# Patient Record
Sex: Male | Born: 2000 | Race: Black or African American | Hispanic: No | Marital: Single | Smoking: Never smoker
Health system: Southern US, Community
[De-identification: ages and names within clinical notes are randomized; demographics above are authoritative.]

## PROBLEM LIST (undated history)

## (undated) DIAGNOSIS — R011 Cardiac murmur, unspecified: Secondary | ICD-10-CM

---
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
## 2020-01-22 ENCOUNTER — Encounter (HOSPITAL_COMMUNITY): Payer: Self-pay

## 2020-01-22 ENCOUNTER — Ambulatory Visit (HOSPITAL_COMMUNITY)
Admission: EM | Admit: 2020-01-22 | Discharge: 2020-01-22 | Disposition: A | Discharge status: Home / Self Care | Payer: 59

## 2020-01-22 ENCOUNTER — Other Ambulatory Visit: Payer: Self-pay

## 2020-01-22 DIAGNOSIS — S0083XA Contusion of other part of head, initial encounter: Secondary | ICD-10-CM | POA: Diagnosis not present

## 2020-01-22 HISTORY — DX: Cardiac murmur, unspecified: R01.1

## 2020-01-22 NOTE — Discharge Instructions (Addendum)
Take 3 ibuprofen every 6 hours for the next 1-2 days and then as needed  Ice the painful area of your jaw today  If any of your pain is not improving, please follow up with your student health  If you develop severe headache, blurry vision, unable to move your jaw please go to the emergency department.

## 2020-01-22 NOTE — ED Notes (Signed)
Bed: UC06 Expected date:  Expected time:  Means of arrival:  Comments: For Appointment
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
## 2021-10-08 ENCOUNTER — Encounter (HOSPITAL_COMMUNITY): Payer: Self-pay

## 2021-10-08 ENCOUNTER — Emergency Department (HOSPITAL_COMMUNITY)
Admission: EM | Admit: 2021-10-08 | Discharge: 2021-10-09 | Disposition: A | Discharge status: Home / Self Care | Payer: 59 | Attending: Emergency Medicine | Admitting: Emergency Medicine

## 2021-10-08 DIAGNOSIS — R251 Tremor, unspecified: Secondary | ICD-10-CM | POA: Diagnosis present

## 2021-10-08 LAB — CBC WITH DIFFERENTIAL/PLATELET
Abs Immature Granulocytes: 0.01 10*3/uL (ref 0.00–0.07)
Basophils Absolute: 0 10*3/uL (ref 0.0–0.1)
Basophils Relative: 0 %
Eosinophils Absolute: 0.1 10*3/uL (ref 0.0–0.5)
Eosinophils Relative: 1 %
HCT: 39.2 % (ref 39.0–52.0)
Hemoglobin: 14.3 g/dL (ref 13.0–17.0)
Immature Granulocytes: 0 %
Lymphocytes Relative: 61 %
Lymphs Abs: 4.3 10*3/uL — ABNORMAL HIGH (ref 0.7–4.0)
MCH: 30 pg (ref 26.0–34.0)
MCHC: 36.5 g/dL — ABNORMAL HIGH (ref 30.0–36.0)
MCV: 82.2 fL (ref 80.0–100.0)
Monocytes Absolute: 0.5 10*3/uL (ref 0.1–1.0)
Monocytes Relative: 7 %
Neutro Abs: 2.2 10*3/uL (ref 1.7–7.7)
Neutrophils Relative %: 31 %
Platelets: 248 10*3/uL (ref 150–400)
RBC: 4.77 MIL/uL (ref 4.22–5.81)
RDW: 12.4 % (ref 11.5–15.5)
WBC: 7.1 10*3/uL (ref 4.0–10.5)
nRBC: 0 % (ref 0.0–0.2)

## 2021-10-08 NOTE — ED Triage Notes (Signed)
Patient arrives from home with complaint of tremors that began 20 minutes ago. Pt states he made a smoothie tonight and feels there may have been some dawn dish detergent in the bottom of it, pt states tremors began shortly after consuming smoothie. Pt also reports tremors seemed to have stopped upon arrival to ED.

## 2021-10-09 LAB — COMPREHENSIVE METABOLIC PANEL
ALT: 25 U/L (ref 0–44)
AST: 35 U/L (ref 15–41)
Albumin: 3.9 g/dL (ref 3.5–5.0)
Alkaline Phosphatase: 55 U/L (ref 38–126)
Anion gap: 6 (ref 5–15)
BUN: 17 mg/dL (ref 6–20)
CO2: 27 mmol/L (ref 22–32)
Calcium: 9.2 mg/dL (ref 8.9–10.3)
Chloride: 106 mmol/L (ref 98–111)
Creatinine, Ser: 0.62 mg/dL (ref 0.61–1.24)
GFR, Estimated: >60 mL/min (ref >60)
Glucose, Bld: 178 mg/dL — ABNORMAL HIGH (ref 70–99)
Potassium: 4.2 mmol/L (ref 3.5–5.1)
Sodium: 139 mmol/L (ref 135–145)
Total Bilirubin: 1 mg/dL (ref 0.3–1.2)
Total Protein: 6.8 g/dL (ref 6.5–8.1)

## 2021-10-09 NOTE — ED Provider Notes (Signed)
[facility] Provider Note   CSN: [id number] Arrival date & time: 10/08/21  2312     History Chief Complaint  Patient presents with   Tremors    Matthew Finley is a 20 y.o. male.  The history is provided by the patient and medical records.   20 year old male presenting to the ED with tremors.  States that started about 20 minutes after he made a smoothie at home.  He did mix and protein powder that he has been taking for the past few weeks.  States he was concerned he may have ingested a little bit of Dawn dish soap from not rinsing the container thoroughly enough.  States it started in his legs and went to his upper body but stopped spontaneously upon arrival to the ED.  He did not have any focal numbness, weakness, or seizure activity.  No chest pain or SOB.  Currently, he feels back to baseline.  Past Medical History:  Diagnosis Date   Heart murmur    no premed required    There are no problems to display for this patient.   History reviewed. No pertinent surgical history.     Family History  Problem Relation Age of Onset   Healthy Mother    Healthy Father     Social History   Tobacco Use   Smoking status: Never   Smokeless tobacco: Never  Vaping Use   Vaping Use: Never used  Substance Use Topics   Alcohol use: Never   Drug use: Never    Home Medications Prior to Admission medications   Not on File    Allergies    Penicillins  Review of Systems   Review of Systems  Neurological:  Positive for tremors (resolved).  All other systems reviewed and are negative.  Physical Exam Updated Vital Signs BP 136/80   Pulse (!) 53   Temp 98.1 F (36.7 C) (Oral)   Resp 18   Ht 6\' 3"  (1.905 m)   Wt 68 kg   SpO2 99%   BMI 18.75 kg/m   Physical Exam Vitals and nursing note reviewed.  Constitutional:      Appearance: He is well-developed.  HENT:     Head: Normocephalic and atraumatic.  Eyes:      Conjunctiva/sclera: Conjunctivae normal.     Pupils: Pupils are equal, round, and reactive to light.  Cardiovascular:     Rate and Rhythm: Normal rate and regular rhythm.     Heart sounds: Normal heart sounds.  Pulmonary:     Effort: Pulmonary effort is normal.     Breath sounds: Normal breath sounds.  Abdominal:     General: Bowel sounds are normal.     Palpations: Abdomen is soft.     Tenderness: There is no guarding.     Hernia: No hernia is present.  Musculoskeletal:        General: Normal range of motion.     Cervical back: Normal range of motion.  Skin:    General: Skin is warm and dry.  Neurological:     Mental Status: He is alert and oriented to person, place, and time.     Comments: AAOx3, answering questions and following commands appropriately; equal strength UE and LE bilaterally; CN grossly intact; moves all extremities appropriately without ataxia; no focal neuro deficits or facial asymmetry appreciated, no tremors or seizure activity observed    ED Results / Procedures / Treatments   Labs (all labs ordered are  listed, but only abnormal results are displayed) Labs Reviewed  COMPREHENSIVE METABOLIC PANEL - Abnormal; Notable for the following components:      Result Value   Glucose, Bld 178 (*)    All other components within normal limits  CBC WITH DIFFERENTIAL/PLATELET - Abnormal; Notable for the following components:   MCHC 36.5 (*)    Lymphs Abs 4.3 (*)    All other components within normal limits    EKG EKG Interpretation  Date/Time:  Friday October 09 2021 01:58:15 EDT Ventricular Rate:  42 PR Interval:  175 QRS Duration: 99 QT Interval:  416 QTC Calculation: 348 R Axis:   78 Text Interpretation: Sinus bradycardia Left atrial enlargement ST elevation c/w early repolarization Confirmed by Tilden Fossa [id number]) on 10/09/2021 2:12:18 AM  Radiology No results found.  Procedures Procedures   Medications Ordered in ED Medications - No data to  display  ED Course  I have reviewed the triage vital signs and the nursing notes.  Pertinent labs & imaging results that were available during my care of the patient were reviewed by me and considered in my medical decision making (see chart for details).    MDM Rules/Calculators/A&P                           20 y.o. M here with tremors that resolved spontaneously on arrival to ED after drinking smoothie at home.  This did contain protein powder.  He was concerned for possible dish soap ingestion due to incomplete rinsing.  He is awake, alert, properly oriented.  He has no visible tremors and no seizure activity.  His neurologic exam is nonfocal.  EKG consistent with early repolarization.  He has not had any chest pain or shortness of breath.  His labs are reassuring.  I have low suspicion this is related to dish soap.  May possibly be related to ingredient in his protein powder.  Doubt acute neurologic or cardiac etiology.  Will have him cut back on this, make sure to hydrate well.  Can follow-up with PCP.  Return here for new concerns.  Final Clinical Impression(s) / ED Diagnoses Final diagnoses:  Tremor    Rx / DC Orders ED Discharge Orders     None        Garlon Hatchet, PA-C 10/09/21 0321    Tilden Fossa, MD 10/09/21 [phone]

## 2021-10-09 NOTE — Discharge Instructions (Signed)
EKG and labs today were reassuring. May need to reduce use of protein powder and pre-workout.  Make sure to stay hydrated with lots of water. Follow-up with your primary care doctor. Return here for new concerns.

## 2022-03-17 ENCOUNTER — Emergency Department (HOSPITAL_COMMUNITY)
Admission: EM | Admit: 2022-03-17 | Discharge: 2022-03-17 | Disposition: A | Discharge status: Home / Self Care | Payer: BC Managed Care – PPO | Attending: Emergency Medicine | Admitting: Emergency Medicine

## 2022-03-17 ENCOUNTER — Other Ambulatory Visit: Payer: Self-pay

## 2022-03-17 ENCOUNTER — Emergency Department (HOSPITAL_COMMUNITY): Payer: BC Managed Care – PPO

## 2022-03-17 ENCOUNTER — Encounter (HOSPITAL_COMMUNITY): Payer: Self-pay

## 2022-03-17 DIAGNOSIS — R17 Unspecified jaundice: Secondary | ICD-10-CM | POA: Insufficient documentation

## 2022-03-17 DIAGNOSIS — R1011 Right upper quadrant pain: Secondary | ICD-10-CM | POA: Diagnosis present

## 2022-03-17 LAB — CBC WITH DIFFERENTIAL/PLATELET
Abs Immature Granulocytes: 0.01 10*3/uL (ref 0.00–0.07)
Basophils Absolute: 0 10*3/uL (ref 0.0–0.1)
Basophils Relative: 0 %
Eosinophils Absolute: 0.1 10*3/uL (ref 0.0–0.5)
Eosinophils Relative: 1 %
HCT: 42.5 % (ref 39.0–52.0)
Hemoglobin: 15.4 g/dL (ref 13.0–17.0)
Immature Granulocytes: 0 %
Lymphocytes Relative: 56 %
Lymphs Abs: 3.6 10*3/uL (ref 0.7–4.0)
MCH: 29.8 pg (ref 26.0–34.0)
MCHC: 36.2 g/dL — ABNORMAL HIGH (ref 30.0–36.0)
MCV: 82.2 fL (ref 80.0–100.0)
Monocytes Absolute: 0.5 10*3/uL (ref 0.1–1.0)
Monocytes Relative: 7 %
Neutro Abs: 2.3 10*3/uL (ref 1.7–7.7)
Neutrophils Relative %: 36 %
Platelets: 257 10*3/uL (ref 150–400)
RBC: 5.17 MIL/uL (ref 4.22–5.81)
RDW: 12.2 % (ref 11.5–15.5)
WBC: 6.4 10*3/uL (ref 4.0–10.5)
nRBC: 0 % (ref 0.0–0.2)

## 2022-03-17 LAB — COMPREHENSIVE METABOLIC PANEL
ALT: 23 U/L (ref 0–44)
AST: 29 U/L (ref 15–41)
Albumin: 4.5 g/dL (ref 3.5–5.0)
Alkaline Phosphatase: 58 U/L (ref 38–126)
Anion gap: 7 (ref 5–15)
BUN: 19 mg/dL (ref 6–20)
CO2: 29 mmol/L (ref 22–32)
Calcium: 9.7 mg/dL (ref 8.9–10.3)
Chloride: 105 mmol/L (ref 98–111)
Creatinine, Ser: 1.11 mg/dL (ref 0.61–1.24)
GFR, Estimated: >60 mL/min (ref >60)
Glucose, Bld: 99 mg/dL (ref 70–99)
Potassium: 3.8 mmol/L (ref 3.5–5.1)
Sodium: 141 mmol/L (ref 135–145)
Total Bilirubin: 2.5 mg/dL — ABNORMAL HIGH (ref 0.3–1.2)
Total Protein: 7.6 g/dL (ref 6.5–8.1)

## 2022-03-17 LAB — URINALYSIS, ROUTINE W REFLEX MICROSCOPIC
Bacteria, UA: NONE SEEN
Bilirubin Urine: NEGATIVE
Glucose, UA: NEGATIVE mg/dL
Hgb urine dipstick: NEGATIVE
Ketones, ur: 5 mg/dL — AB
Nitrite: NEGATIVE
Protein, ur: NEGATIVE mg/dL
Specific Gravity, Urine: 1.03 (ref 1.005–1.030)
pH: 6 (ref 5.0–8.0)

## 2022-03-17 LAB — LIPASE, BLOOD: Lipase: 42 U/L (ref 11–51)

## 2022-03-17 MED ORDER — IOHEXOL 300 MG/ML  SOLN
100.0000 mL | Freq: Once | INTRAMUSCULAR | Status: AC | PRN
  Administered 2022-03-17: 100 mL via INTRAVENOUS

## 2022-03-17 NOTE — ED Notes (Signed)
Requested patient to urinate. 

## 2022-03-17 NOTE — ED Triage Notes (Signed)
Pt reports with right sided abdominal/ flank pain x 2 days. Pt states that he had elevated bilirubin levels when his labs were drawn x 1 week ago.  ?

## 2022-03-17 NOTE — Discharge Instructions (Signed)
Your workup was overall reassuring in the ED today. It is recommended that you follow up with [facility] for further evaluation of your pain and elevated bilirubin today.  ? ?Take Ibuprofen as needed for pain and drink plenty of fluids to stay hydrated.  ? ?Return to the ED for any new/worsening symptoms including worsening pain, vomiting, fevers > 100.4, eyes/skin turning yellow, or any other new/concerning symptoms ?

## 2022-03-17 NOTE — ED Provider Triage Note (Signed)
Emergency Medicine Provider Triage Evaluation Note ? ?Matthew Finley , a 21 y.o. male  was evaluated in triage.  Pt complains of gradual onset, constant, worsening, right upper quadrant pain that began yesterday.  Family member at bedside reports that patient had a physical last week.  He was called and told that his bilirubin was up.  Per chart review CMP on 03/30 with a T. bili of 2.8.  Last labs in November which was normal.  He was not having pain at that time so they did not think much of it.  The provider had scheduled an outpatient ultrasound however they have not heard back from scheduling yet.  When patient began complaining of pain mom became concerned.  She denies any nausea, vomiting, diarrhea, fevers, chills.  No urinary symptoms. ? ?Review of Systems  ?Positive: + RUQ pain ?Negative: - fevers, chills, nausea, vomiting, urinary symptoms, diarrhea, constipation ? ?Physical Exam  ?BP (!) 153/73 (BP Location: Right Arm)   Pulse (!) 56   Temp 98.3 ?F (36.8 ?C) (Oral)   Resp 16   Ht 6\' 3"  (1.905 m)   Wt 68 kg   SpO2 99%   BMI 18.75 kg/m?  ?Gen:   Awake, no distress   ?Resp:  Normal effort  ?MSK:   Moves extremities without difficulty  ?Other:  + RUQ TTP ? ?Medical Decision Making  ?Medically screening exam initiated at 7:56 PM.  Appropriate orders placed.  was informed that the remainder of the evaluation will be completed by another provider, this initial triage assessment does not replace that evaluation, and the importance of remaining in the ED until their evaluation is complete. ? ? ?  ?Matthew Bailiff, PA-C ?03/17/22 1957 ? ?

## 2022-03-17 NOTE — ED Provider Notes (Signed)
?[facility]-EMERGENCY DEPT ?Provider Note ? ? ?CSN: [id number] ?Arrival date & time: 03/17/22  1925 ? ?  ? ?History ? ?Chief Complaint  ?Patient presents with  ? Flank Pain  ? Abdominal Pain  ? ? ?Matthew Finley is a 21 y.o. male who presents to the ED today with complaint of gradual onset, constant, worsening, right upper quadrant pain that began yesterday.  Family member at bedside reports that patient had a physical last week.  He was called and told that his bilirubin was up.  Per chart review CMP on 03/30 with a T. bili of 2.8.  Last labs in November which was normal.  He was not having pain at that time so they did not think much of it.  The provider had scheduled an outpatient ultrasound however they have not heard back from scheduling yet.  When patient began complaining of pain mom became concerned.  She denies any nausea, vomiting, diarrhea, fevers, chills.  No urinary symptoms. ? ?The history is provided by the patient, a parent and medical records.  ? ?  ? ?Home Medications ?Prior to Admission medications   ?Not on File  ?   ? ?Allergies    ?Penicillins   ? ?Review of Systems   ?Review of Systems  ?Constitutional:  Negative for chills and fever.  ?Gastrointestinal:  Positive for abdominal pain. Negative for constipation, diarrhea, nausea and vomiting.  ?All other systems reviewed and are negative. ? ?Physical Exam ?Updated Vital Signs ?BP (!) 148/83 (BP Location: Right Arm)   Pulse (!) 56   Temp 98.9 ?F (37.2 ?C)   Resp 17   Ht 6\' 3"  (1.905 m)   Wt 68 kg   SpO2 100%   BMI 18.75 kg/m?  ?Physical Exam ?Vitals and nursing note reviewed.  ?Constitutional:   ?   Appearance: He is not ill-appearing.  ?HENT:  ?   Head: Normocephalic and atraumatic.  ?Eyes:  ?   General: No scleral icterus. ?   Conjunctiva/sclera: Conjunctivae normal.  ?Cardiovascular:  ?   Rate and Rhythm: Normal rate and regular rhythm.  ?   Heart sounds: Normal heart sounds.  ?Pulmonary:  ?   Effort: Pulmonary  effort is normal.  ?   Breath sounds: Normal breath sounds. No wheezing, rhonchi or rales.  ?Abdominal:  ?   Palpations: Abdomen is soft.  ?   Tenderness: There is abdominal tenderness in the right upper quadrant. There is no right CVA tenderness or left CVA tenderness.  ?Musculoskeletal:  ?   Cervical back: Neck supple.  ?Skin: ?   General: Skin is warm and dry.  ?   Coloration: Skin is not jaundiced.  ?Neurological:  ?   Mental Status: He is alert.  ? ? ?ED Results / Procedures / Treatments   ?Labs ?(all labs ordered are listed, but only abnormal results are displayed) ?Labs Reviewed  ?COMPREHENSIVE METABOLIC PANEL - Abnormal; Notable for the following components:  ?    Result Value  ? Total Bilirubin 2.5 (*)   ? All other components within normal limits  ?CBC WITH DIFFERENTIAL/PLATELET - Abnormal; Notable for the following components:  ? MCHC 36.2 (*)   ? All other components within normal limits  ?URINALYSIS, ROUTINE W REFLEX MICROSCOPIC - Abnormal; Notable for the following components:  ? Ketones, ur 5 (*)   ? Leukocytes,Ua TRACE (*)   ? All other components within normal limits  ?LIPASE, BLOOD  ?HEPATITIS PANEL, ACUTE  ? ? ?EKG ?None ? ?  Radiology ?CT ABDOMEN PELVIS W CONTRAST ? ?Result Date: 03/17/2022 ?CLINICAL DATA:  Right-sided abdominal pain.  Elevated bilirubin. EXAM: CT ABDOMEN AND PELVIS WITH CONTRAST TECHNIQUE: Multidetector CT imaging of the abdomen and pelvis was performed using the standard protocol following bolus administration of intravenous contrast. RADIATION DOSE REDUCTION: This exam was performed according to the departmental dose-optimization program which includes automated exposure control, adjustment of the mA and/or kV according to patient size and/or use of iterative reconstruction technique. CONTRAST:  100mL OMNIPAQUE IOHEXOL 300 MG/ML  SOLN COMPARISON:  Right upper quadrant ultrasound earlier today FINDINGS: Lower chest: No acute airspace disease or pleural effusion. Hepatobiliary: The  liver is normal in size and density. No focal hepatic lesion. Gallbladder physiologically distended, no calcified stone. No biliary dilatation. Pancreas: No ductal dilatation or inflammation. Spleen: Normal in size without focal abnormality. Adrenals/Urinary Tract: Normal adrenal glands. No hydronephrosis or perinephric edema. Homogeneous renal enhancement. No visualized renal calculi or focal lesion. Urinary bladder is empty and not well assessed. Stomach/Bowel: Bowel assessment is limited in the absence of enteric contrast and paucity of intra-abdominal fat. Stomach is decompressed. There is no small bowel obstruction or inflammation. The appendix is not visualized normal or abnormal. No pericecal inflammation. Small volume of colonic stool. Vascular/Lymphatic: Normal caliber abdominal aorta. Patent portal vein. No acute vascular findings. No abdominopelvic adenopathy. Reproductive: Prostate is unremarkable. Other: No free air, free fluid, or intra-abdominal fluid collection. No abdominal wall hernia. Musculoskeletal: There are no acute or suspicious osseous abnormalities. IMPRESSION: No acute abnormality in the abdomen/pelvis. Electronically Signed   By: Narda RutherfordMelanie  Sanford M.D.   On: 03/17/2022 21:58  ? ?US Abdomen Limited RUQ (LIVER/GB) ? ?Result Date: 03/17/2022 ?CLINICAL DATA:  Right upper quadrant pain EXAM: ULTRASOUND ABDOMEN LIMITED RIGHT UPPER QUADRANT COMPARISON:  None. FINDINGS: Gallbladder: No gallstones or wall thickening visualized. No sonographic Murphy sign noted by sonographer. Common bile duct: Diameter: 5.2 mm Liver: No focal lesion identified. Within normal limits in parenchymal echogenicity. Portal vein is patent on color Doppler imaging with normal direction of blood flow towards the liver. Other: None. IMPRESSION: Negative examination Electronically Signed   By: Jasmine PangKim  Fujinaga M.D.   On: 03/17/2022 20:56   ? ?Procedures ?Procedures  ? ? ?Medications Ordered in ED ?Medications  ?iohexol  (OMNIPAQUE) 300 MG/ML solution 100 mL (100 mLs Intravenous Contrast Given 03/17/22 2142)  ? ? ?ED Course/ Medical Decision Making/ A&P ?  ?                        ?Medical Decision Making ?21 year old male who presents to the ED today with complaint of right upper quadrant pain for the past 2 days.  Had labs done about 1 to 2 weeks ago for physical noted to have elevated T. bili of 2.8.  On arrival to the ED today vitals are stable.  Patient is noted to have right upper quadrant tenderness palpation.  No rebound or guarding.  No right lower quadrant abdominal touch palpation to suggest appendicitis.  Patient does not appear to have scleral icterus on exam or jaundice at this time. No nausea, vomiting, diarrhea or fevers.  We will plan for repeat labs and right upper quadrant ultrasound for further evaluation at this time.   ? ?Work-up overall reassuring besides elevated T. bili at 2.5 today.  Slightly downtrending from previous at 2.8.  Right upper quadrant ultrasound negative at this time.  Hepatitis panel added today as well as CT scan for further evaluation. ? ?  CT scan without acute findings.  We will plan to discharge patient home with GI referral given elevated bilirubin and right upper quadrant pain.  Recommend ibuprofen and Tylenol as needed.  Mom and patient are in agreement with plan and patient is stable for discharge. ? ?Amount and/or Complexity of Data Reviewed ?Labs: ordered. ?   Details: CBC without leukocytosis.  Hemoglobin stable at 15.4 ?CMP without electrolyte abnormalities. T bili elevated at 2.5. No other LFT abnormalities. ?Lipase WNL at 42 ?Radiology: ordered. ?   Details: RUQ ultrasound negative ? ?Risk ?Prescription drug management. ? ? ? ? ? ? ? ? ? ?Final Clinical Impression(s) / ED Diagnoses ?Final diagnoses:  ?RUQ abdominal pain  ?Total bilirubin, elevated  ? ? ?Rx / DC Orders ?ED Discharge Orders   ? ? None  ? ?  ? ? ? ?Discharge Instructions   ? ?  ?Your workup was overall reassuring in  the ED today. It is recommended that you follow up with [facility] for further evaluation of your pain and elevated bilirubin today.  ? ?Take Ibuprofen as needed for pain and drink plenty of fluids to stay

## 2022-03-18 LAB — HEPATITIS PANEL, ACUTE
HCV Ab: NONREACTIVE
Hep A IgM: NONREACTIVE
Hep B C IgM: NONREACTIVE
Hepatitis B Surface Ag: NONREACTIVE

## 2023-05-10 IMAGING — CT CT ABD-PELV W/ CM
2 of 4 series · 16 of 46 positions shown, 18 images · IV contrast (agent unspecified)
Comparison: Right upper quadrant ultrasound earlier today

CLINICAL DATA: Right-sided abdominal pain.  Elevated bilirubin.

EXAM:
CT ABDOMEN AND PELVIS WITH CONTRAST
TECHNIQUE: Multidetector CT imaging of the abdomen and pelvis was performed
using the standard protocol following bolus administration of
intravenous contrast.

[Series 2: axial st · axial · 0.69mm/px · z∈[-541,-111]mm · 13 of 98 slices shown, 15 images]
[im 6/98  soft-tissue]
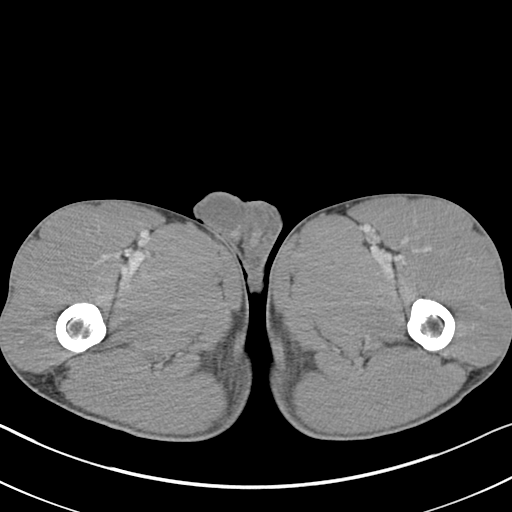
[im 6/98  bone]
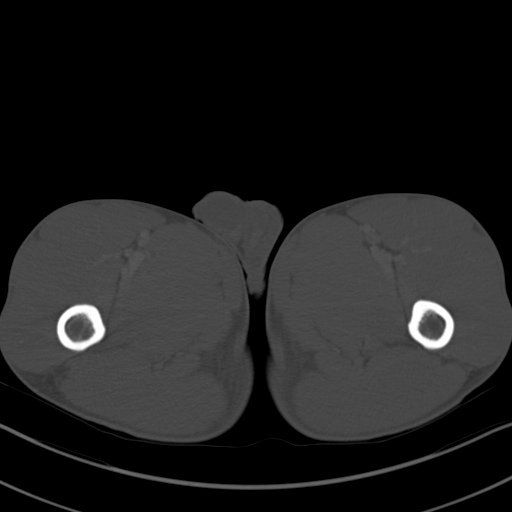
[im 11/98  soft-tissue]
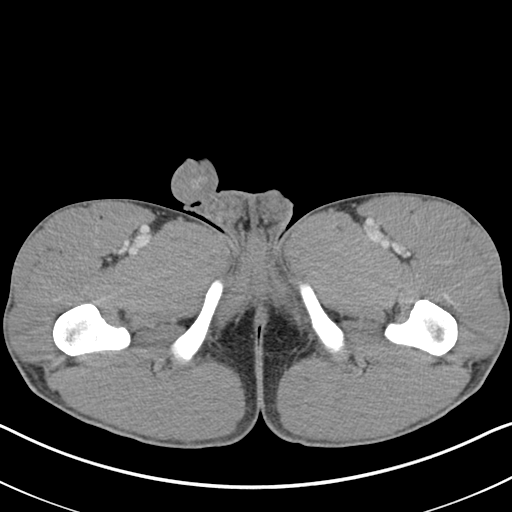
[im 22/98  soft-tissue]
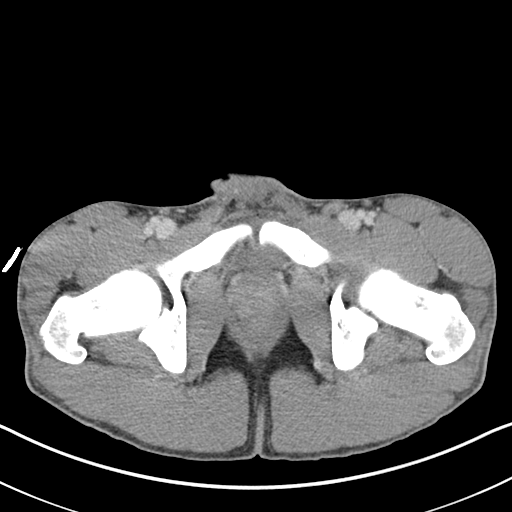
[im 27/98  soft-tissue]
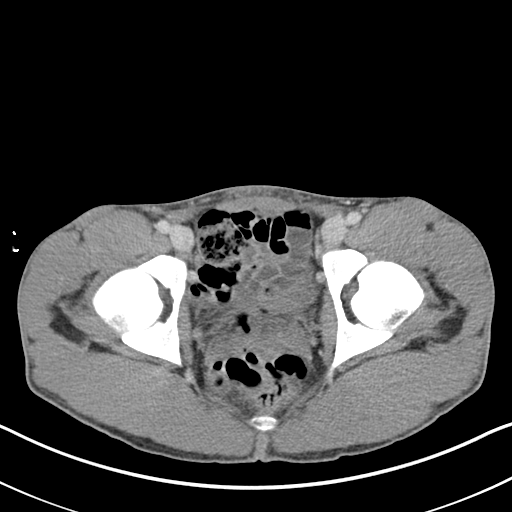
[im 33/98  soft-tissue]
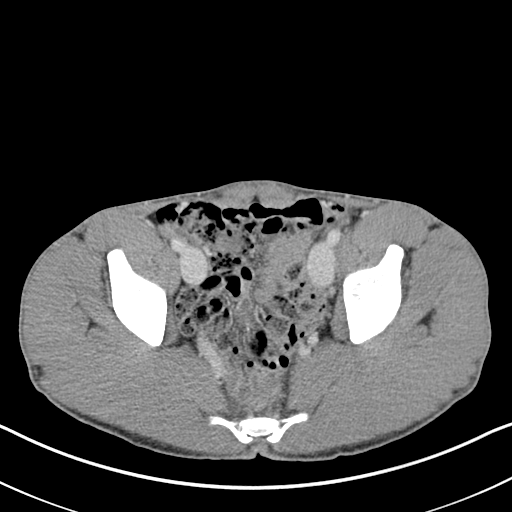
[im 44/98  soft-tissue]
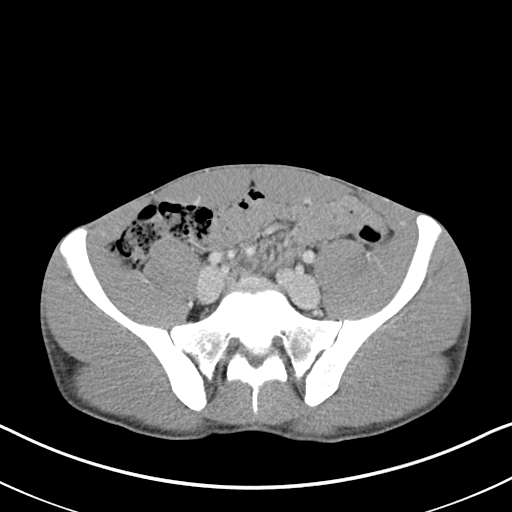
[im 49/98  soft-tissue]
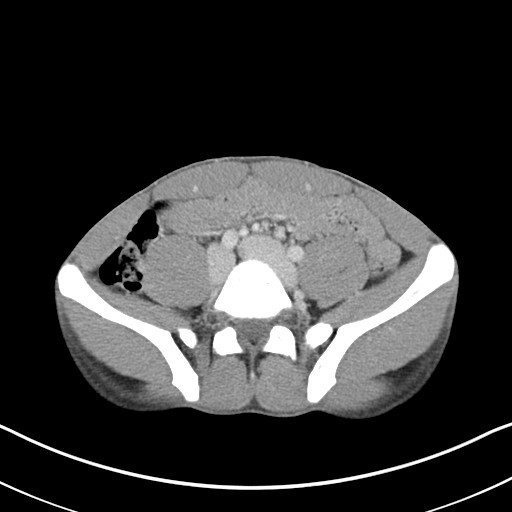
[im 54/98  soft-tissue]
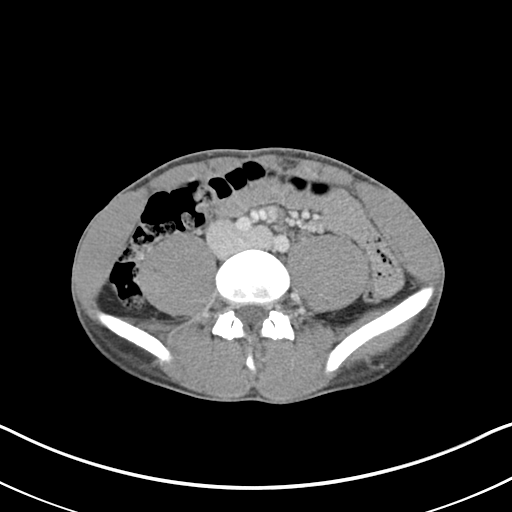
[im 65/98  soft-tissue]
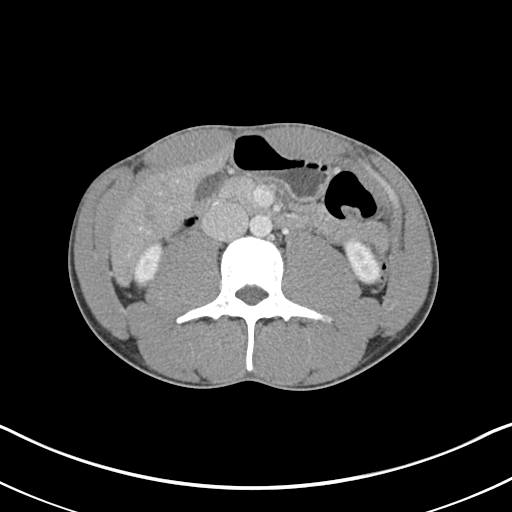
[im 65/98  bone]
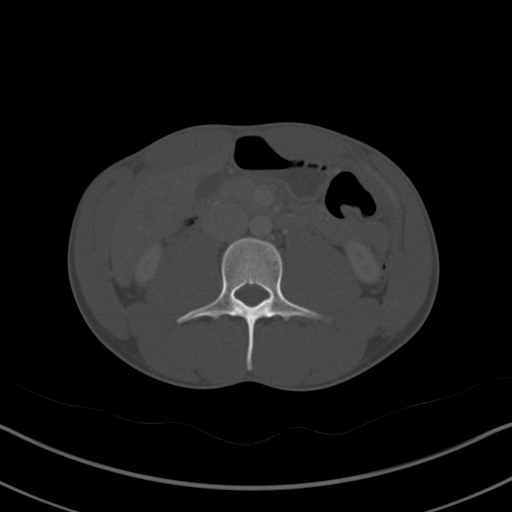
[im 71/98  soft-tissue]
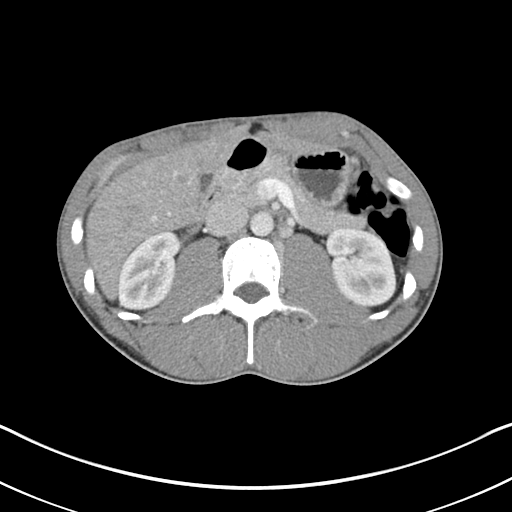
[im 76/98  soft-tissue]
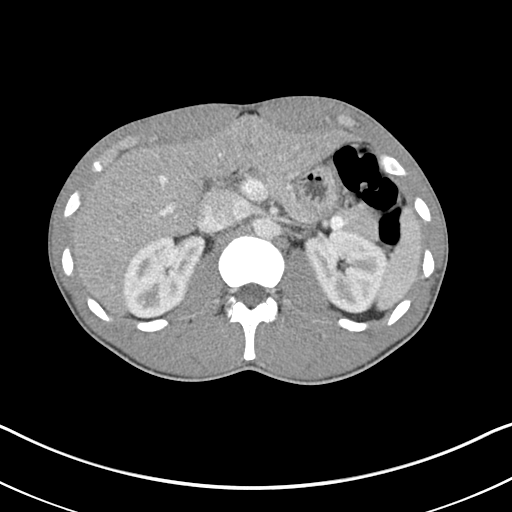
[im 87/98  soft-tissue]
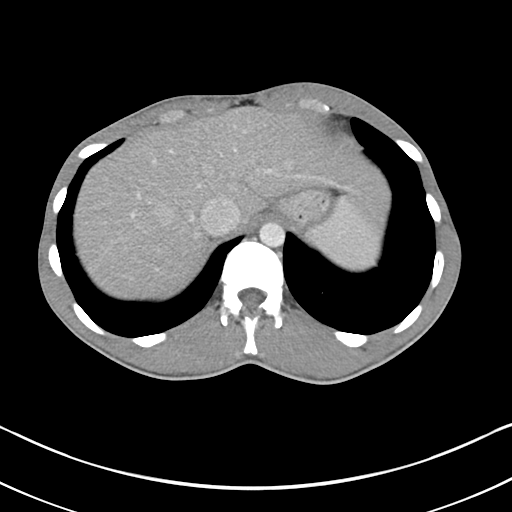
[im 92/98  soft-tissue]
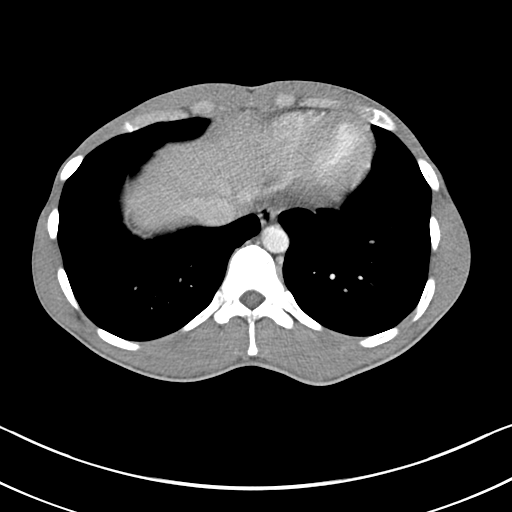

[Series 4: coronal st · coronal · 0.78mm/px · 3 of 131 slices shown]
[im 44/131  soft-tissue]
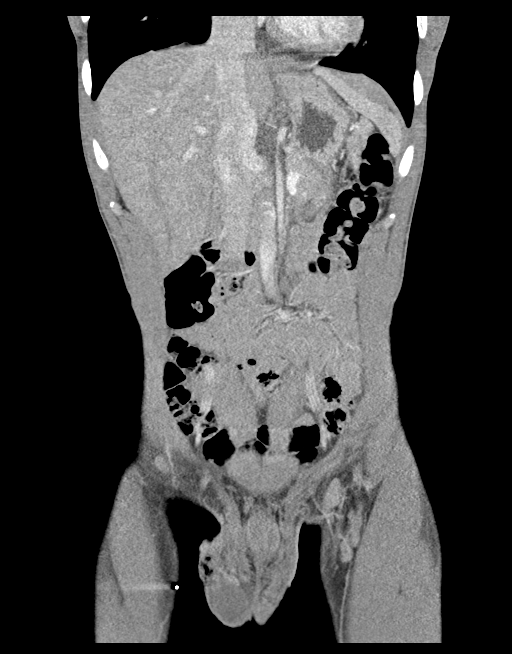
[im 58/131  soft-tissue]
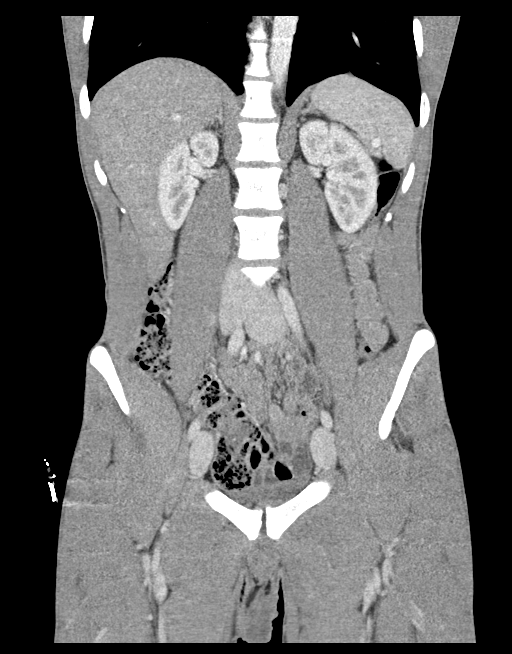
[im 73/131  soft-tissue]
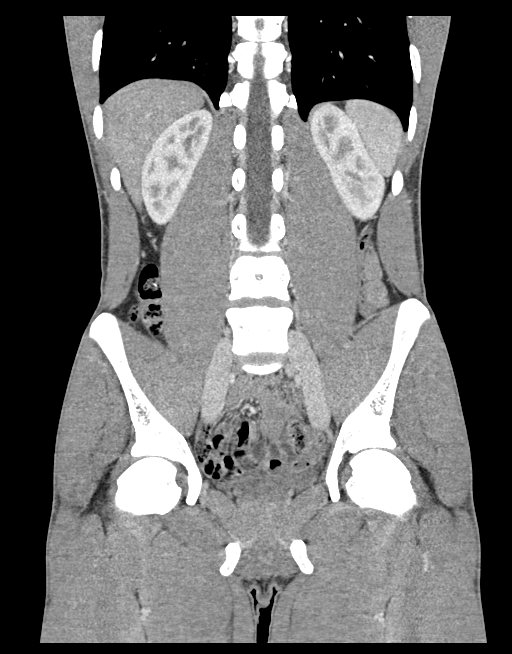

[16 of 46 positions shown; findings below may reference images not displayed]

RADIATION DOSE REDUCTION: This exam was performed according to the
departmental dose-optimization program which includes automated
exposure control, adjustment of the mA and/or kV according to
patient size and/or use of iterative reconstruction technique.

CONTRAST:  100mL OMNIPAQUE IOHEXOL 300 MG/ML  SOLN
FINDINGS: Lower chest: No acute airspace disease or pleural effusion.

Hepatobiliary: The liver is normal in size and density. No focal
hepatic lesion. Gallbladder physiologically distended, no calcified
stone. No biliary dilatation.

Pancreas: No ductal dilatation or inflammation.

Spleen: Normal in size without focal abnormality.

Adrenals/Urinary Tract: Normal adrenal glands. No hydronephrosis or
perinephric edema. Homogeneous renal enhancement. No visualized
renal calculi or focal lesion. Urinary bladder is empty and not well
assessed.

Stomach/Bowel: Bowel assessment is limited in the absence of enteric
contrast and paucity of intra-abdominal fat. Stomach is
decompressed. There is no small bowel obstruction or inflammation.
The appendix is not visualized normal or abnormal. No pericecal
inflammation. Small volume of colonic stool.

Vascular/Lymphatic: Normal caliber abdominal aorta. Patent portal
vein. No acute vascular findings. No abdominopelvic adenopathy.

Reproductive: Prostate is unremarkable.

Other: No free air, free fluid, or intra-abdominal fluid collection.
No abdominal wall hernia.

Musculoskeletal: There are no acute or suspicious osseous
abnormalities.
IMPRESSION: No acute abnormality in the abdomen/pelvis.
